# Patient Record
Sex: Female | Born: 1990 | Race: Black or African American | Hispanic: No | Marital: Single | Smoking: Never smoker
Health system: Southern US, Community
[De-identification: ages and names within clinical notes are randomized; demographics above are authoritative.]

---

## 2016-10-09 ENCOUNTER — Ambulatory Visit (INDEPENDENT_AMBULATORY_CARE_PROVIDER_SITE_OTHER): Payer: Self-pay

## 2016-10-09 ENCOUNTER — Ambulatory Visit (HOSPITAL_COMMUNITY)
Admission: EM | Admit: 2016-10-09 | Discharge: 2016-10-09 | Disposition: A | Discharge status: Home / Self Care | Payer: Self-pay | Attending: Internal Medicine | Admitting: Internal Medicine

## 2016-10-09 ENCOUNTER — Encounter (HOSPITAL_COMMUNITY): Payer: Self-pay | Admitting: Emergency Medicine

## 2016-10-09 DIAGNOSIS — R109 Unspecified abdominal pain: Secondary | ICD-10-CM

## 2016-10-09 DIAGNOSIS — K5901 Slow transit constipation: Secondary | ICD-10-CM

## 2016-10-09 NOTE — ED Triage Notes (Signed)
The patient presented to the [facility] with a complaint of abdominal cramping and diarrhea x 4 days. 

## 2016-10-09 NOTE — ED Provider Notes (Signed)
CSN: [id number]     Arrival date & time 10/09/16  1308 History   First MD Initiated Contact with Patient 10/09/16 1458     Chief Complaint  Patient presents with  . Abdominal Cramping  . Diarrhea   (Consider location/radiation/quality/duration/timing/severity/associated sxs/prior Treatment) 26 year old female states that she has had diarrhea for 3 days. Watery diarrhea too numerous to count. No nausea or vomiting. She has slight pain across the lower abdomen. Last week she was having constipation not having BMs for a few days. On Wednesday she took 2 Ducolax tablets, Thursday no diarrhea or BM. On Friday she started having the watery diarrheas as above. The lower abdominal pain is minimal and intermittent. Denies fever, chills or feeling sickly. She is able to eat. Decreased appetite.      History reviewed. No pertinent past medical history. History reviewed. No pertinent surgical history. History reviewed. No pertinent family history. Social History  Substance Use Topics  . Smoking status: Never Smoker  . Smokeless tobacco: Never Used  . Alcohol use No   OB History    No data available     Review of Systems  Constitutional: Positive for activity change. Negative for fever.  HENT: Negative.   Respiratory: Negative.   Cardiovascular: Negative.   Gastrointestinal: Positive for abdominal pain and diarrhea. Negative for blood in stool, nausea and vomiting.  Genitourinary: Negative.   Neurological: Negative.   All other systems reviewed and are negative.   Allergies  Patient has no known allergies.  Home Medications   Prior to Admission medications   Not on File   Meds Ordered and Administered this Visit  Medications - No data to display  BP 137/74 (BP Location: Left Arm)   Pulse 70   Temp 98.8 F (37.1 C) (Oral)   Resp 16   SpO2 100%  No data found.   Physical Exam  Constitutional: She is oriented to person, place, and time. She appears well-developed and  well-nourished. No distress.  Eyes: EOM are normal.  Neck: Normal range of motion. Neck supple.  Cardiovascular: Normal rate, regular rhythm, normal heart sounds and intact distal pulses.   Pulmonary/Chest: Effort normal and breath sounds normal. No respiratory distress. She has no wheezes. She has no rales.  Abdominal: Soft. Bowel sounds are normal. She exhibits no distension.  Abdomen completely soft. Minor tenderness across the lower abdomen just below the umbilicus. No rebound or guarding. No masses.  Musculoskeletal: She exhibits no edema.  Neurological: She is alert and oriented to person, place, and time. She exhibits normal muscle tone.  Skin: Skin is warm and dry.  Psychiatric: She has a normal mood and affect.  Nursing note and vitals reviewed.   Urgent Care Course     Procedures (including critical care time)  Labs Review Labs Reviewed - No data to display  Imaging Review Dg Abd 1 View  Result Date: 10/09/2016 CLINICAL DATA:  Acute lower abdominal pain, constipation. EXAM: ABDOMEN - 1 VIEW COMPARISON:  None. FINDINGS: The bowel gas pattern is normal. Mild amount of stool is seen in the right colon. No radio-opaque calculi or other significant radiographic abnormality are seen. IMPRESSION: No evidence of bowel obstruction or ileus. Mild stool burden is noted. Electronically Signed   By: Lupita Raider, M.D.   On: 10/09/2016 15:23     Visual Acuity Review  Right Eye Distance:   Left Eye Distance:   Bilateral Distance:    Right Eye Near:   Left Eye Near:  Bilateral Near:         MDM   1. Constipation by delayed colonic transit   2. Abdominal cramping    The primary problem is that you have not been moving stool out of your colon. The only thing that can pass through the colon is liquid. Thus what seems that you are having diarrhea. X-rays show that you are still constipated from last week. Have a moderate amount of stool in the colon that needs to pass  through. Recommend that she use MiraLAX to empty the colon. Once the colon is empty of stool and gas the discomfort should discontinue. For worsening abdominal pain, fever or chills pain need to go to the emergency department. Fisher to continue to drink plenty of fluids.  Verbal and written discharge instructions have been reviewed and given to the patient  by the provider to include medications and other care measures.    Hayden Rasmussen, NP 10/09/16 1535    Hayden Rasmussen, NP 10/09/16 1536

## 2016-10-09 NOTE — Discharge Instructions (Signed)
The primary problem is that you have not been moving stool out of your colon. The only thing that can pass through the colon is liquid. Thus what seems that you are having diarrhea. X-rays show that you are still constipated from last week. Have a moderate amount of stool in the colon that needs to pass through. Recommend that she use MiraLAX to empty the colon. Once the colon is empty of stool and gas the discomfort should discontinue. For worsening abdominal pain, fever or chills pain need to go to the emergency department. Fisher to continue to drink plenty of fluids.

## 2017-12-27 IMAGING — DX DG ABDOMEN 1V
2 series · 2 of 2 positions shown · non-contrast
Comparison: None.

CLINICAL DATA: Acute lower abdominal pain, constipation.

EXAM:
ABDOMEN - 1 VIEW

[abdomen kub (1 of 2)]
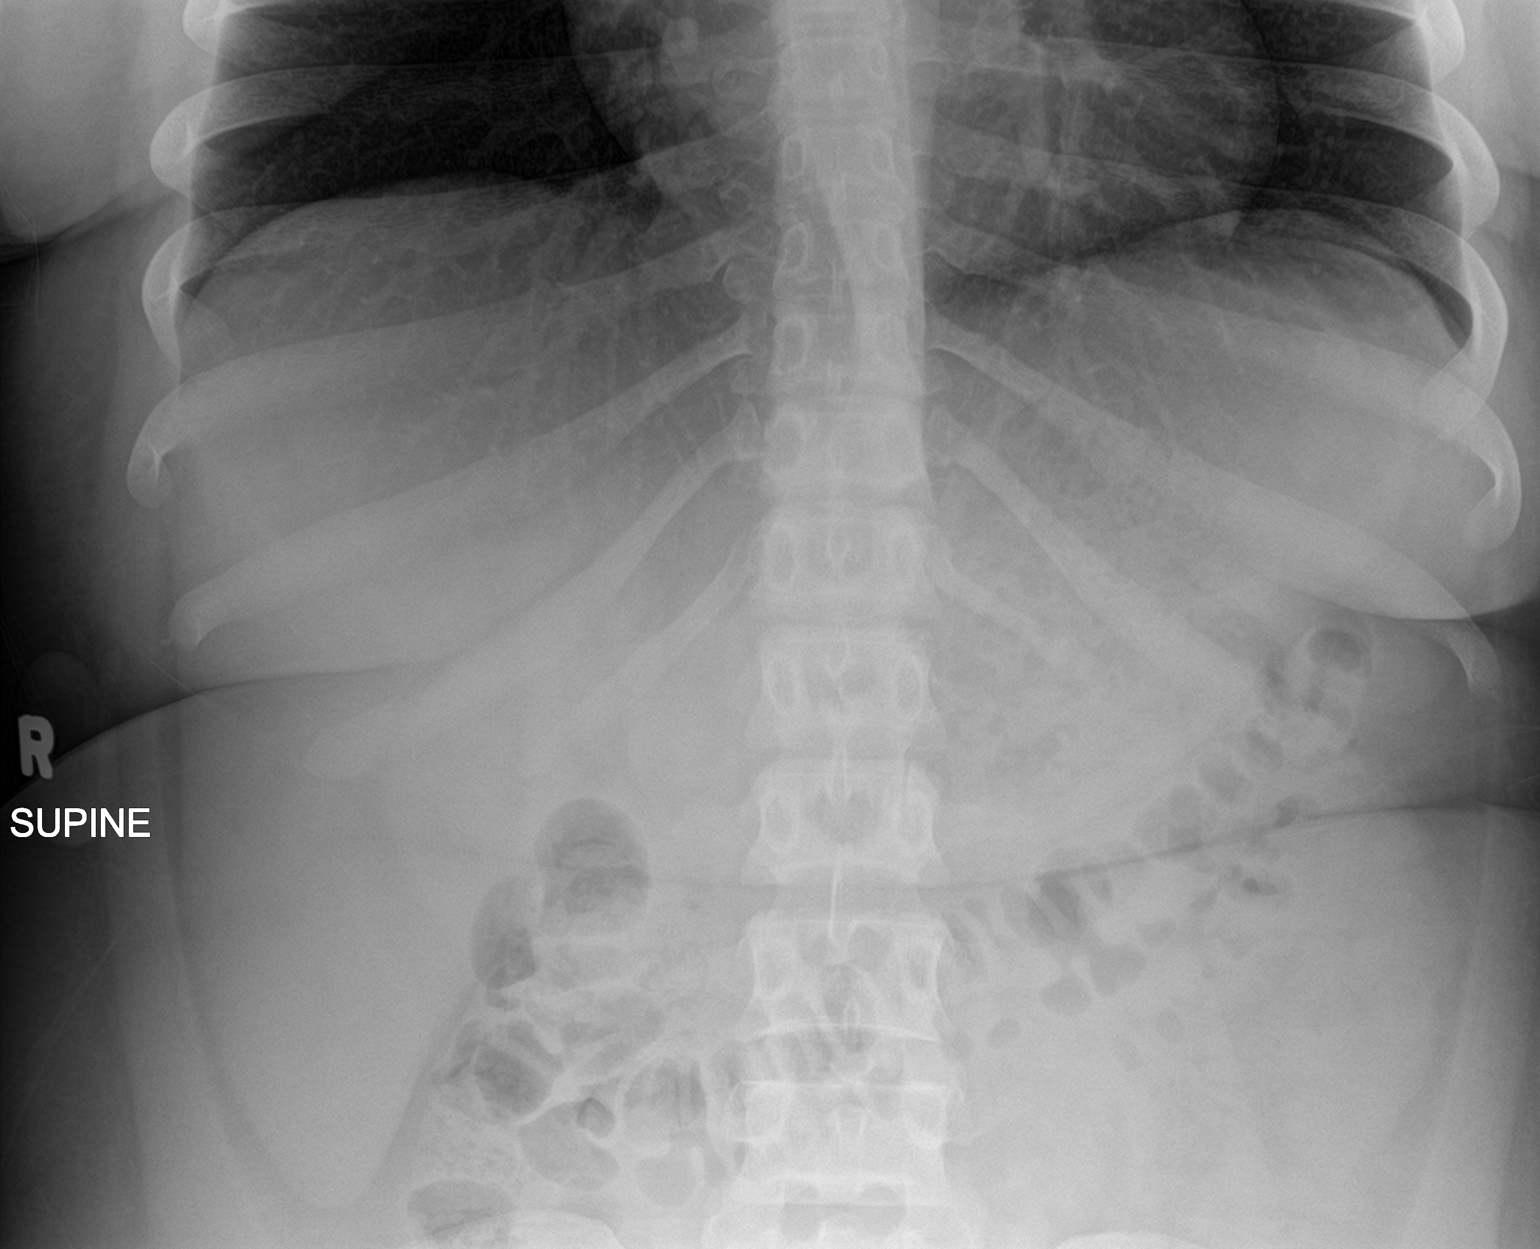

[abdomen kub (2 of 2)]
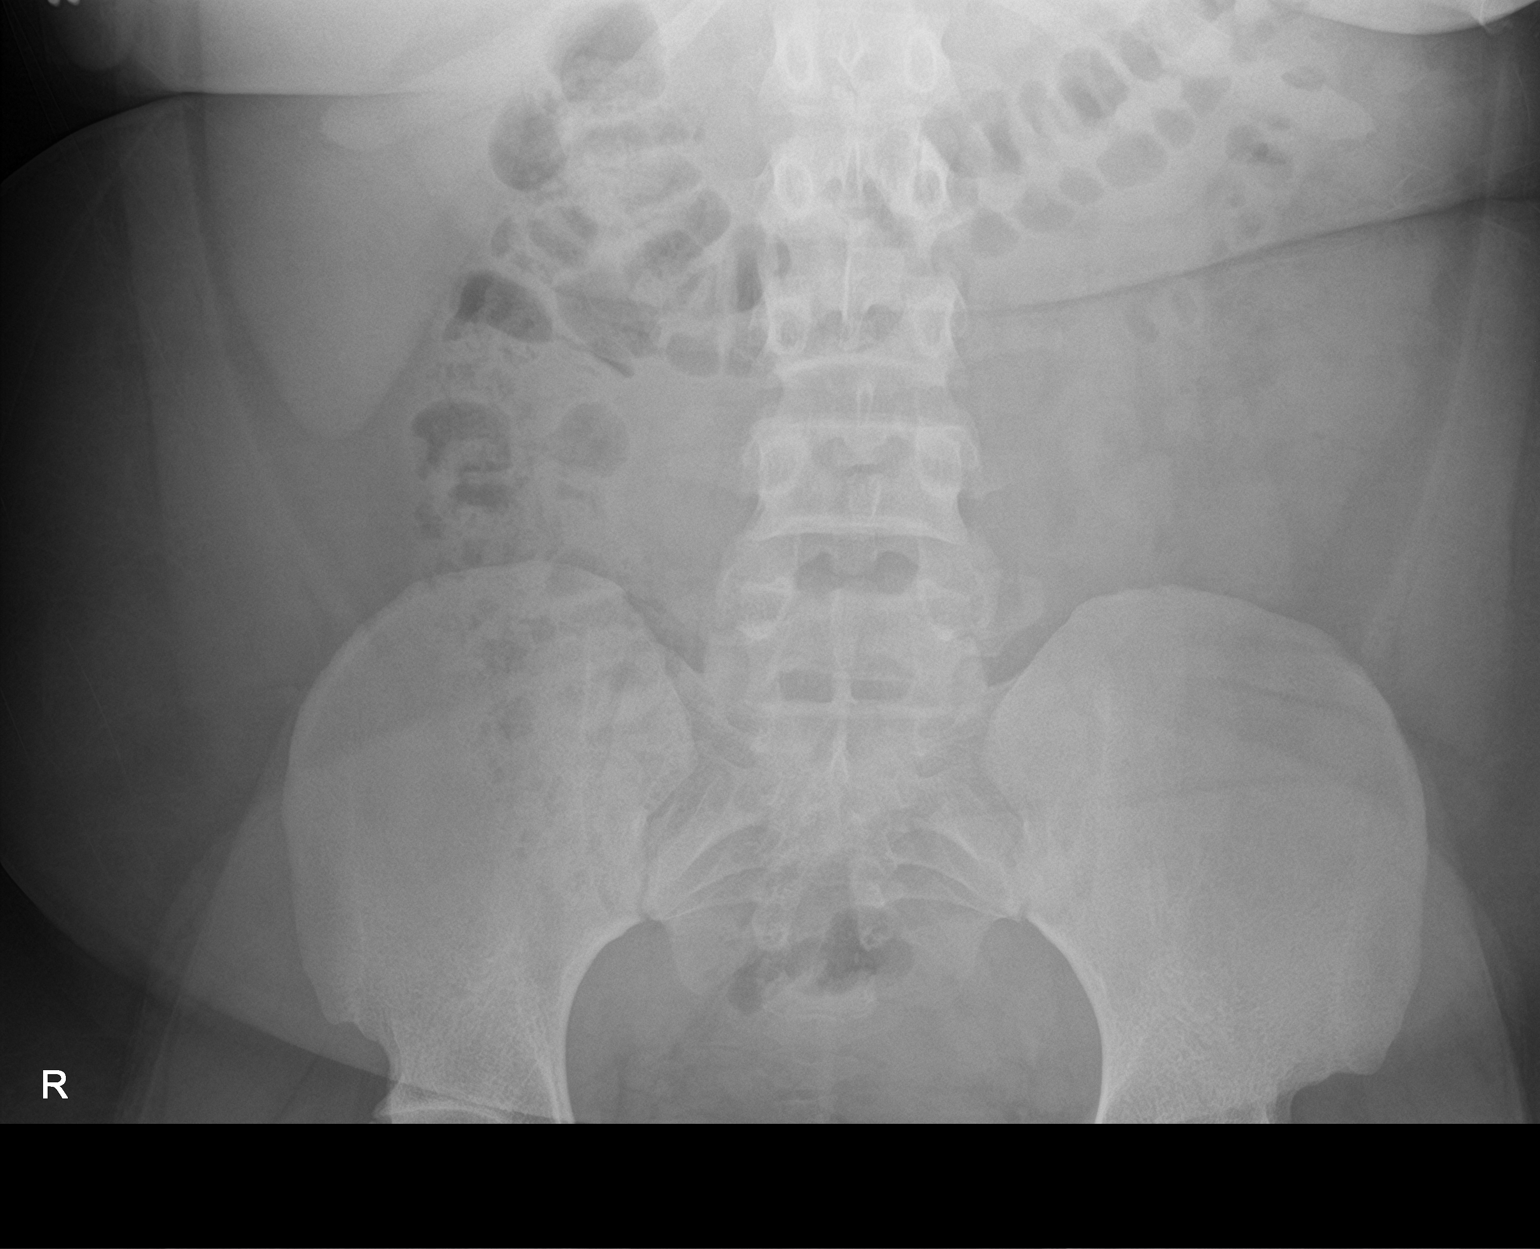

[2 of 2 positions shown; findings below may reference images not displayed]

FINDINGS: The bowel gas pattern is normal. Mild amount of stool is seen in the
right colon. No radio-opaque calculi or other significant
radiographic abnormality are seen.
IMPRESSION: No evidence of bowel obstruction or ileus. Mild stool burden is
noted.
